# Patient Record
Sex: Male | Born: 1960 | Hispanic: No | Marital: Single | Smoking: Never smoker
Health system: Southern US, Community
[De-identification: ages and names within clinical notes are randomized; demographics above are authoritative.]

## PROBLEM LIST (undated history)

## (undated) DIAGNOSIS — Z789 Other specified health status: Secondary | ICD-10-CM

---

## 1997-04-11 HISTORY — PX: NOSE SURGERY: SHX723

## 2010-04-03 ENCOUNTER — Emergency Department (HOSPITAL_COMMUNITY)
Admission: EM | Admit: 2010-04-03 | Discharge: 2010-04-04 | Payer: Self-pay | Source: Home / Self Care | Admitting: Emergency Medicine

## 2010-06-21 LAB — POCT I-STAT, CHEM 8
Chloride: 105 mEq/L (ref 96–112)
Creatinine, Ser: 0.9 mg/dL (ref 0.4–1.5)
HCT: 43 % (ref 39.0–52.0)
Hemoglobin: 14.6 g/dL (ref 13.0–17.0)
Potassium: 4.4 mEq/L (ref 3.5–5.1)
Sodium: 140 mEq/L (ref 135–145)

## 2010-06-21 LAB — CBC
HCT: 40.7 % (ref 39.0–52.0)
Hemoglobin: 13.7 g/dL (ref 13.0–17.0)
MCH: 28.4 pg (ref 26.0–34.0)
MCV: 84.4 fL (ref 78.0–100.0)
RBC: 4.82 MIL/uL (ref 4.22–5.81)

## 2010-06-21 LAB — DIFFERENTIAL
Eosinophils Absolute: 0.1 10*3/uL (ref 0.0–0.7)
Lymphs Abs: 2.4 10*3/uL (ref 0.7–4.0)
Monocytes Absolute: 0.6 10*3/uL (ref 0.1–1.0)
Monocytes Relative: 8 % (ref 3–12)
Neutrophils Relative %: 60 % (ref 43–77)

## 2010-06-21 LAB — RAPID URINE DRUG SCREEN, HOSP PERFORMED
Benzodiazepines: NOT DETECTED
Cocaine: NOT DETECTED
Opiates: NOT DETECTED
Tetrahydrocannabinol: NOT DETECTED

## 2010-06-21 LAB — POCT CARDIAC MARKERS: Myoglobin, poc: 40.2 ng/mL (ref 12–200)

## 2018-01-12 DIAGNOSIS — K219 Gastro-esophageal reflux disease without esophagitis: Secondary | ICD-10-CM | POA: Diagnosis not present

## 2018-01-12 DIAGNOSIS — E7800 Pure hypercholesterolemia, unspecified: Secondary | ICD-10-CM | POA: Diagnosis not present

## 2018-01-12 DIAGNOSIS — Z23 Encounter for immunization: Secondary | ICD-10-CM | POA: Diagnosis not present

## 2018-01-12 DIAGNOSIS — Z125 Encounter for screening for malignant neoplasm of prostate: Secondary | ICD-10-CM | POA: Diagnosis not present

## 2018-01-12 DIAGNOSIS — Z0000 Encounter for general adult medical examination without abnormal findings: Secondary | ICD-10-CM | POA: Diagnosis not present

## 2018-03-01 DIAGNOSIS — K649 Unspecified hemorrhoids: Secondary | ICD-10-CM | POA: Diagnosis not present

## 2019-01-11 ENCOUNTER — Other Ambulatory Visit: Payer: Self-pay

## 2019-01-11 DIAGNOSIS — Z20822 Contact with and (suspected) exposure to covid-19: Secondary | ICD-10-CM

## 2019-01-12 LAB — NOVEL CORONAVIRUS, NAA: SARS-CoV-2, NAA: NOT DETECTED

## 2019-01-18 DIAGNOSIS — Z0000 Encounter for general adult medical examination without abnormal findings: Secondary | ICD-10-CM | POA: Diagnosis not present

## 2019-01-25 DIAGNOSIS — E7800 Pure hypercholesterolemia, unspecified: Secondary | ICD-10-CM | POA: Diagnosis not present

## 2019-01-25 DIAGNOSIS — Z125 Encounter for screening for malignant neoplasm of prostate: Secondary | ICD-10-CM | POA: Diagnosis not present

## 2019-01-25 DIAGNOSIS — Z131 Encounter for screening for diabetes mellitus: Secondary | ICD-10-CM | POA: Diagnosis not present

## 2019-04-24 ENCOUNTER — Other Ambulatory Visit: Payer: Self-pay

## 2019-04-24 ENCOUNTER — Ambulatory Visit: Payer: Self-pay | Admitting: Cardiology

## 2019-07-25 DIAGNOSIS — J029 Acute pharyngitis, unspecified: Secondary | ICD-10-CM | POA: Diagnosis not present

## 2020-01-31 DIAGNOSIS — R319 Hematuria, unspecified: Secondary | ICD-10-CM | POA: Diagnosis not present

## 2020-01-31 DIAGNOSIS — Z125 Encounter for screening for malignant neoplasm of prostate: Secondary | ICD-10-CM | POA: Diagnosis not present

## 2020-01-31 DIAGNOSIS — Z23 Encounter for immunization: Secondary | ICD-10-CM | POA: Diagnosis not present

## 2020-01-31 DIAGNOSIS — Z0000 Encounter for general adult medical examination without abnormal findings: Secondary | ICD-10-CM | POA: Diagnosis not present

## 2020-01-31 DIAGNOSIS — E7800 Pure hypercholesterolemia, unspecified: Secondary | ICD-10-CM | POA: Diagnosis not present

## 2020-02-03 DIAGNOSIS — Z125 Encounter for screening for malignant neoplasm of prostate: Secondary | ICD-10-CM | POA: Diagnosis not present

## 2020-07-11 DIAGNOSIS — Z20822 Contact with and (suspected) exposure to covid-19: Secondary | ICD-10-CM | POA: Diagnosis not present

## 2020-07-11 DIAGNOSIS — Z03818 Encounter for observation for suspected exposure to other biological agents ruled out: Secondary | ICD-10-CM | POA: Diagnosis not present

## 2020-07-23 DIAGNOSIS — R55 Syncope and collapse: Secondary | ICD-10-CM | POA: Diagnosis not present

## 2020-07-23 DIAGNOSIS — R053 Chronic cough: Secondary | ICD-10-CM | POA: Diagnosis not present

## 2020-07-23 DIAGNOSIS — T148XXA Other injury of unspecified body region, initial encounter: Secondary | ICD-10-CM | POA: Diagnosis not present

## 2020-07-27 DIAGNOSIS — M545 Low back pain, unspecified: Secondary | ICD-10-CM | POA: Diagnosis not present

## 2020-07-27 DIAGNOSIS — R55 Syncope and collapse: Secondary | ICD-10-CM | POA: Diagnosis not present

## 2020-07-27 DIAGNOSIS — R058 Other specified cough: Secondary | ICD-10-CM | POA: Diagnosis not present

## 2020-07-29 DIAGNOSIS — R55 Syncope and collapse: Secondary | ICD-10-CM | POA: Diagnosis not present

## 2020-09-30 DIAGNOSIS — R058 Other specified cough: Secondary | ICD-10-CM | POA: Diagnosis not present

## 2020-10-30 DIAGNOSIS — K219 Gastro-esophageal reflux disease without esophagitis: Secondary | ICD-10-CM | POA: Diagnosis not present

## 2020-10-30 DIAGNOSIS — R058 Other specified cough: Secondary | ICD-10-CM | POA: Diagnosis not present

## 2020-10-30 DIAGNOSIS — R55 Syncope and collapse: Secondary | ICD-10-CM | POA: Diagnosis not present

## 2021-02-05 DIAGNOSIS — E7800 Pure hypercholesterolemia, unspecified: Secondary | ICD-10-CM | POA: Diagnosis not present

## 2021-02-05 DIAGNOSIS — Z0000 Encounter for general adult medical examination without abnormal findings: Secondary | ICD-10-CM | POA: Diagnosis not present

## 2021-02-05 DIAGNOSIS — R7303 Prediabetes: Secondary | ICD-10-CM | POA: Diagnosis not present

## 2021-02-05 DIAGNOSIS — Z125 Encounter for screening for malignant neoplasm of prostate: Secondary | ICD-10-CM | POA: Diagnosis not present

## 2021-02-05 DIAGNOSIS — K219 Gastro-esophageal reflux disease without esophagitis: Secondary | ICD-10-CM | POA: Diagnosis not present

## 2021-02-19 ENCOUNTER — Ambulatory Visit (INDEPENDENT_AMBULATORY_CARE_PROVIDER_SITE_OTHER): Payer: BC Managed Care – PPO

## 2021-02-19 ENCOUNTER — Ambulatory Visit: Payer: BC Managed Care – PPO | Admitting: Podiatry

## 2021-02-19 ENCOUNTER — Ambulatory Visit (INDEPENDENT_AMBULATORY_CARE_PROVIDER_SITE_OTHER): Payer: BC Managed Care – PPO | Admitting: Podiatry

## 2021-02-19 ENCOUNTER — Other Ambulatory Visit: Payer: Self-pay

## 2021-02-19 DIAGNOSIS — M21619 Bunion of unspecified foot: Secondary | ICD-10-CM | POA: Diagnosis not present

## 2021-02-24 ENCOUNTER — Encounter: Payer: Self-pay | Admitting: Podiatry

## 2021-02-24 NOTE — Progress Notes (Signed)
  Subjective:  Patient ID: Isaiah Stewart, male    DOB: [birth year],  MRN: [id number]  No chief complaint on file.   60 y.o. male presents with the above complaint.  Patient presents with complaint of left bunion deformity.  Patient states is painful to touch.  He states it aches once in a while however the pain is mildly in manageable.  He wanted to discuss options conservative as well as surgical to decrease the bump size.  He denies any other acute complaints.  He has not seen anyone else prior to seeing me.  He would like to discuss treatment options for this.  Review of Systems: Negative except as noted in the HPI. Denies N/V/F/Ch.  No past medical history on file.  Current Outpatient Medications:    FLONASE SENSIMIST 27.5 MCG/SPRAY nasal spray, SMARTSIG:1 Spray(s) Both Nares Daily, Disp: , Rfl:   Social History   Tobacco Use  Smoking Status Not on file  Smokeless Tobacco Not on file    Not on File Objective:  There were no vitals filed for this visit. There is no height or weight on file to calculate BMI. Constitutional Well developed. Well nourished.  Vascular Dorsalis pedis pulses palpable bilaterally. Posterior tibial pulses palpable bilaterally. Capillary refill normal to all digits.  No cyanosis or clubbing noted. Pedal hair growth normal.  Neurologic Normal speech. Oriented to person, place, and time. Epicritic sensation to light touch grossly present bilaterally.  Dermatologic Nails well groomed and normal in appearance. No open wounds. No skin lesions.  Orthopedic: Normal joint ROM without pain or crepitus bilaterally. Hallux abductovalgus deformity present.  This is a tracking not a track bound deformity.  No intra-articular pain at the first MPJ.  Moding but moderate bunion deformity present Left 1st MPJ diminished range of motion. Left 1st TMT without gross hypermobility. Right 1st MPJ diminished range of motion  Right 1st TMT without gross  hypermobility. Lesser digital contractures present bilaterally.   Radiographs: Taken and reviewed. Hallux abductovalgus deformity present. Metatarsal parabola normal. 1st/2nd IMA: Moderate bunion; TSP: 5 out of 7  Assessment:   1. Bunion    Plan:  Patient was evaluated and treated and all questions answered.  Hallux abductovalgus deformity, left -XR as above. -I discussed all conservative treatment options with the patient as well as surgical options.  Patient has a moderate bunion deformity he will benefit from surgical reduction of the bunion.  I discussed my surgical plan with the patient in extensive detail.  At this time he would like to discuss with him about the surgery a with his family and get back to me when he is ready to have it corrected. -I discussed shoe gear moderate modification orthotics as well.  No follow-ups on file.

## 2021-03-17 ENCOUNTER — Emergency Department (HOSPITAL_COMMUNITY)
Admission: EM | Admit: 2021-03-17 | Discharge: 2021-03-18 | Disposition: A | Discharge status: Home / Self Care | Payer: BC Managed Care – PPO | Attending: Medical | Admitting: Medical

## 2021-03-17 ENCOUNTER — Encounter (HOSPITAL_COMMUNITY): Payer: Self-pay

## 2021-03-17 ENCOUNTER — Emergency Department (HOSPITAL_COMMUNITY): Payer: BC Managed Care – PPO

## 2021-03-17 DIAGNOSIS — Z5321 Procedure and treatment not carried out due to patient leaving prior to being seen by health care provider: Secondary | ICD-10-CM | POA: Diagnosis not present

## 2021-03-17 DIAGNOSIS — I959 Hypotension, unspecified: Secondary | ICD-10-CM | POA: Diagnosis not present

## 2021-03-17 DIAGNOSIS — R000 Tachycardia, unspecified: Secondary | ICD-10-CM | POA: Diagnosis not present

## 2021-03-17 DIAGNOSIS — R059 Cough, unspecified: Secondary | ICD-10-CM | POA: Diagnosis not present

## 2021-03-17 DIAGNOSIS — S060X0A Concussion without loss of consciousness, initial encounter: Secondary | ICD-10-CM | POA: Diagnosis not present

## 2021-03-17 DIAGNOSIS — U071 COVID-19: Secondary | ICD-10-CM | POA: Diagnosis not present

## 2021-03-17 DIAGNOSIS — R55 Syncope and collapse: Secondary | ICD-10-CM | POA: Diagnosis not present

## 2021-03-17 DIAGNOSIS — R0902 Hypoxemia: Secondary | ICD-10-CM | POA: Diagnosis not present

## 2021-03-17 HISTORY — DX: Other specified health status: Z78.9

## 2021-03-17 LAB — BASIC METABOLIC PANEL
Anion gap: 9 (ref 5–15)
BUN: 14 mg/dL (ref 6–20)
CO2: 25 mmol/L (ref 22–32)
Calcium: 8.7 mg/dL — ABNORMAL LOW (ref 8.9–10.3)
Chloride: 103 mmol/L (ref 98–111)
Creatinine, Ser: 1.07 mg/dL (ref 0.61–1.24)
GFR, Estimated: >60 mL/min (ref >60)
Glucose, Bld: 113 mg/dL — ABNORMAL HIGH (ref 70–99)
Potassium: 3.9 mmol/L (ref 3.5–5.1)
Sodium: 137 mmol/L (ref 135–145)

## 2021-03-17 LAB — CBC WITH DIFFERENTIAL/PLATELET
Abs Immature Granulocytes: 0.08 10*3/uL — ABNORMAL HIGH (ref 0.00–0.07)
Basophils Absolute: 0 10*3/uL (ref 0.0–0.1)
Basophils Relative: 0 %
Eosinophils Absolute: 0 10*3/uL (ref 0.0–0.5)
Eosinophils Relative: 0 %
HCT: 40.8 % (ref 39.0–52.0)
Hemoglobin: 13.4 g/dL (ref 13.0–17.0)
Immature Granulocytes: 1 %
Lymphocytes Relative: 4 %
Lymphs Abs: 0.6 10*3/uL — ABNORMAL LOW (ref 0.7–4.0)
MCH: 28.8 pg (ref 26.0–34.0)
MCHC: 32.8 g/dL (ref 30.0–36.0)
MCV: 87.6 fL (ref 80.0–100.0)
Monocytes Absolute: 0.9 10*3/uL (ref 0.1–1.0)
Monocytes Relative: 7 %
Neutro Abs: 11.6 10*3/uL — ABNORMAL HIGH (ref 1.7–7.7)
Neutrophils Relative %: 88 %
Platelets: 189 10*3/uL (ref 150–400)
RBC: 4.66 MIL/uL (ref 4.22–5.81)
RDW: 12.3 % (ref 11.5–15.5)
WBC: 13.2 10*3/uL — ABNORMAL HIGH (ref 4.0–10.5)
nRBC: 0 % (ref 0.0–0.2)

## 2021-03-17 LAB — RESP PANEL BY RT-PCR (FLU A&B, COVID) ARPGX2
Influenza A by PCR: NEGATIVE
Influenza B by PCR: NEGATIVE
SARS Coronavirus 2 by RT PCR: POSITIVE — AB

## 2021-03-17 NOTE — ED Provider Notes (Signed)
Emergency Medicine Provider Triage Evaluation Note  Isaiah Stewart , a 60 y.o. male  was evaluated in triage.  Pt complains of syncopal episode that occurred earlier tonight.  Patient reports that he has had URI-like symptoms since yesterday after being around family member with similar symptoms.  He states that he woke up from a nap around 8 PM today and went to use the bathroom.  He urinated normally however when he got up to go to the kitchen he passed out.  He does report that he hit the posterior aspect of his head.  He is not anticoagulated.  He states that he has not had 1 episode of PE in April and was told that he likely vagal down and is unsure if this is what happened today.  He denies any chest pain or shortness of breath or headache or to syncope.  Currently he states he feels fine besides having a cough, body aches, fevers.  Review of Systems  Positive: + syncope, cough, fever, body aches, congestion Negative: - chest pain or SOB  Physical Exam  BP 101/69   Pulse (!) 109   Temp 99.8 F (37.7 C) (Oral)   Resp 16   SpO2 95%  Gen:   Awake, no distress   Resp:  Normal effort  MSK:   Moves extremities without difficulty  Other:  Mild TTP to posterior aspect of head. No raccoon's sign or battle's sign. Following all commands. LCTAB.   Medical Decision Making  Medically screening exam initiated at 9:35 PM.  Appropriate orders placed.  Isaiah Stewart was informed that the remainder of the evaluation will be completed by another provider, this initial triage assessment does not replace that evaluation, and the importance of remaining in the ED until their evaluation is complete.     Tanda Rockers, PA-C 03/17/21 2138    Gerhard Munch, MD 03/17/21 2139

## 2021-03-17 NOTE — ED Triage Notes (Signed)
Pt BIB [facility] for eval of syncopal episode. Fell, no thinners. Initial pressure for EMS was 80/50 for ED. #16G R FA, rec'd 500cc NS. 120/84 after bolus of fluids. No PMH, no meds. CBG 150, 12 lead unremarkable. 97% 2LPM.

## 2021-03-18 DIAGNOSIS — R55 Syncope and collapse: Secondary | ICD-10-CM | POA: Diagnosis not present

## 2021-03-18 DIAGNOSIS — U071 COVID-19: Secondary | ICD-10-CM | POA: Diagnosis not present

## 2021-03-18 NOTE — ED Notes (Signed)
PT  decided to leave  .  IV removed 

## 2021-04-16 ENCOUNTER — Ambulatory Visit: Payer: BC Managed Care – PPO | Admitting: Cardiology

## 2021-04-16 ENCOUNTER — Encounter: Payer: Self-pay | Admitting: Cardiology

## 2021-04-16 ENCOUNTER — Other Ambulatory Visit: Payer: Self-pay

## 2021-04-16 VITALS — BP 124/79 | HR 89 | Temp 98.3°F | Resp 17 | Ht 64.0 in | Wt 138.4 lb

## 2021-04-16 DIAGNOSIS — R55 Syncope and collapse: Secondary | ICD-10-CM

## 2021-04-16 DIAGNOSIS — E7800 Pure hypercholesterolemia, unspecified: Secondary | ICD-10-CM | POA: Diagnosis not present

## 2021-04-16 NOTE — Progress Notes (Signed)
Primary Physician/Referring:  Lennie Odor, PA  Patient ID: Isaiah Stewart, male    DOB: [birth year], 61 y.o.   MRN: [id number]  Chief Complaint  Patient presents with   New Patient (Initial Visit)   Loss of Consciousness   HPI:    Isaiah Stewart  is a 61 y.o. [place] male, with no signal prior cardiovascular history except for hyperlipidemia not on therapy, referred to me for evaluation of recurrent syncope.  Patient has had 3 episodes of syncope since April 2022, all of them associated with acute illness.  First episode occurred with severe back pain due to severe cough and development of COVID and the second episode recently when he presented to the emergency room again when he had COVID infection and cough and marked diaphoresis, sweating and not feeling well.  Recently he still recuperating and feels weak and tired and has mild cough occasionally but otherwise feels well.  Past Medical History:  Diagnosis Date   Medical history non-contributory    Past Surgical History:  Procedure Laterality Date   NOSE SURGERY  1999   Family History  Problem Relation Age of Onset   Lung cancer Mother 50   Heart attack Father 18   Hypertension Brother    Hyperlipidemia Brother    Anxiety disorder Brother     Social History   Tobacco Use   Smoking status: Never   Smokeless tobacco: Never  Substance Use Topics   Alcohol use: Not Currently   Marital Status: Single  ROS  Review of Systems  Constitutional: Positive for malaise/fatigue.  Cardiovascular:  Negative for chest pain, dyspnea on exertion and leg swelling.  Respiratory:  Positive for cough.   Gastrointestinal:  Negative for melena.  Objective  Blood pressure 124/79, pulse 89, temperature 98.3 F (36.8 C), temperature source Temporal, resp. rate 17, height 5\' 4"  (1.626 m), weight 138 lb 6.4 oz (62.8 kg), SpO2 98 %. Body mass index is 23.76 kg/m.  Vitals with BMI 04/16/2021 03/17/2021 03/17/2021  Height 5\' 4"  - -  Weight  138 lbs 6 oz - -  BMI 99991111 - -  Systolic A999333 98 99991111  Diastolic 79 69 69  Pulse 89 111 109    Orthostatic VS for the past 72 hrs (Last 3 readings):  Orthostatic BP Patient Position BP Location Cuff Size Orthostatic Pulse  04/16/21 0923 112/77 Standing Left Arm Normal 95  04/16/21 0922 122/82 Sitting Left Arm Normal 88  04/16/21 0921 125/80 Supine Left Arm Normal 83    Physical Exam Neck:     Vascular: No carotid bruit or JVD.  Cardiovascular:     Rate and Rhythm: Normal rate and regular rhythm.     Pulses: Intact distal pulses.     Heart sounds: Normal heart sounds. No murmur heard.   No gallop.  Pulmonary:     Effort: Pulmonary effort is normal.     Breath sounds: Normal breath sounds.  Abdominal:     General: Bowel sounds are normal.     Palpations: Abdomen is soft.  Musculoskeletal:        General: No swelling.     Laboratory examination:   Recent Labs    03/17/21 2136  NA 137  K 3.9  CL 103  CO2 25  GLUCOSE 113*  BUN 14  CREATININE 1.07  CALCIUM 8.7*  GFRNONAA >60   CrCl cannot be calculated (Patient's most recent lab result is older than the maximum 21 days allowed.).  CMP Latest Ref  Rng & Units 03/17/2021 04/04/2010  Glucose 70 - 99 mg/dL 113(H) 94  BUN 6 - 20 mg/dL 14 30(H)  Creatinine 0.61 - 1.24 mg/dL 1.07 0.9  Sodium 135 - 145 mmol/L 137 140  Potassium 3.5 - 5.1 mmol/L 3.9 4.4  Chloride 98 - 111 mmol/L 103 105  CO2 22 - 32 mmol/L 25 -  Calcium 8.9 - 10.3 mg/dL 8.7(L) -   CBC Latest Ref Rng & Units 03/17/2021 04/04/2010 04/04/2010  WBC 4.0 - 10.5 K/uL 13.2(H) - 7.8  Hemoglobin 13.0 - 17.0 g/dL 13.4 14.6 13.7  Hematocrit 39.0 - 52.0 % 40.8 43.0 40.7  Platelets 150 - 400 K/uL 189 - 213   External labs:   Labs 02/05/2021:  Total cholesterol 271, triglycerides 204, HDL 48, LDL 185.  Non-HDL cholesterol: 23.  TSH normal at 1.15.  Urine analysis normal. Medications and allergies  No Known Allergies   Medication prior to this encounter:    Outpatient Medications Prior to Visit  Medication Sig Dispense Refill   MULTIPLE VITAMIN PO Take by mouth.     omeprazole (PRILOSEC) 10 MG capsule Take 10 mg by mouth daily.     Vitamin D, Cholecalciferol, 25 MCG (1000 UT) CAPS Take 1 capsule by mouth daily.     FLONASE SENSIMIST 27.5 MCG/SPRAY nasal spray SMARTSIG:1 Spray(s) Both Nares Daily     No facility-administered medications prior to visit.     Medication list after today's encounter   Current Outpatient Medications  Medication Instructions   MULTIPLE VITAMIN PO Oral   omeprazole (PRILOSEC) 10 mg, Oral, Daily   Vitamin D, Cholecalciferol, 25 MCG (1000 UT) CAPS 1 capsule, Oral, Daily    Radiology:   CT scan of the head 03/17/2021: Within normal limits.  Chest x-ray PA and lateral view 03/17/2021: Within normal limits.  Cardiac Studies:   NA  EKG:   EKG 04/16/2021: Normal sinus rhythm at rate of 84 bpm, normal axis.  No evidence of ischemia, normal EKG.    Assessment     ICD-10-CM   1. Vasovagal syncope  R55 EKG 12-Lead    PCV ECHOCARDIOGRAM COMPLETE    PCV CARDIAC STRESS TEST    CT CARDIAC SCORING ([facility] LOCATIONS ONLY)    2. Hypercholesteremia  E78.00 CT CARDIAC SCORING ([facility] LOCATIONS ONLY)       Medications Discontinued During This Encounter  Medication Reason   FLONASE SENSIMIST 27.5 MCG/SPRAY nasal spray     No orders of the defined types were placed in this encounter.  Orders Placed This Encounter  Procedures   CT CARDIAC SCORING ([facility] LOCATIONS ONLY)    Standing Status:   Future    Standing Expiration Date:   06/14/2021    Order Specific Question:   Preferred imaging location?    Answer:   [facility]   PCV CARDIAC STRESS TEST    Standing Status:   Future    Standing Expiration Date:   06/14/2021   EKG 12-Lead   PCV ECHOCARDIOGRAM COMPLETE    Standing Status:   Future    Standing Expiration Date:   04/16/2022   Recommendations:   Isaiah Stewart is a 61 y.o. [place] male, with no signal prior  cardiovascular history except for hyperlipidemia not on therapy, referred to me for evaluation of recurrent syncope.  Patient has had 3 episodes of syncope since April 2022, all of them associated with acute illness.  First episode occurred with severe back pain due to severe cough and development of COVID and the  second episode recently when he presented to the emergency room again when he had COVID infection and cough and marked diaphoresis, sweating and not feeling well.  Physical examination is completely normal, EKG is normal, there is no family history of sudden cardiac death.  He lives a healthy lifestyle, also tries to exercise regularly.  He is minimally orthostatic, he still recuperating from acute illness but has started to feel better.  I suspect vasovagal syncope.  Fall precautions discussed.  I will schedule him for a routine treadmill exercise stress test and also an echocardiogram, in view of mild hyperlipidemia which I suspect is probably familial, I have also recommended a coronary calcium score for risk stratification.  I would like to see him back in 6 weeks for follow-up and I will make further recommendations.  I have reviewed the CT scan report and chest x-ray report along with EKG and labs from the emergency room visit.    Adrian Prows, MD, [facility] 04/16/2021, 10:24 AM Office: [phone]

## 2021-05-10 ENCOUNTER — Other Ambulatory Visit: Payer: Self-pay

## 2021-05-10 ENCOUNTER — Ambulatory Visit: Payer: BC Managed Care – PPO

## 2021-05-10 DIAGNOSIS — R55 Syncope and collapse: Secondary | ICD-10-CM

## 2021-05-14 ENCOUNTER — Other Ambulatory Visit: Payer: Self-pay

## 2021-05-14 ENCOUNTER — Ambulatory Visit: Payer: BC Managed Care – PPO

## 2021-05-14 DIAGNOSIS — R55 Syncope and collapse: Secondary | ICD-10-CM

## 2021-05-16 LAB — PCV CARDIAC STRESS TEST: ST Depression (mm): 0 mm

## 2021-05-17 ENCOUNTER — Ambulatory Visit
Admission: RE | Admit: 2021-05-17 | Discharge: 2021-05-17 | Disposition: A | Discharge status: Home / Self Care | Payer: No Typology Code available for payment source | Source: Ambulatory Visit | Attending: Cardiology | Admitting: Cardiology

## 2021-05-17 DIAGNOSIS — E7800 Pure hypercholesterolemia, unspecified: Secondary | ICD-10-CM

## 2021-05-17 DIAGNOSIS — R55 Syncope and collapse: Secondary | ICD-10-CM

## 2021-05-17 NOTE — Progress Notes (Signed)
Coronary calcium score 05/17/2021: LM 0 LAD 179 LCx 88 RCA 0 Total Alperstein score 267, [place] database percentile 92. Ascending and descending thoracic aortic measurements normal.  Visualized noncardiac structures do not reveal any significant abnormality.

## 2021-05-28 ENCOUNTER — Ambulatory Visit: Payer: BC Managed Care – PPO | Admitting: Cardiology

## 2021-05-28 ENCOUNTER — Other Ambulatory Visit: Payer: Self-pay

## 2021-05-28 ENCOUNTER — Encounter: Payer: Self-pay | Admitting: Cardiology

## 2021-05-28 VITALS — BP 120/82 | HR 98 | Temp 97.7°F | Ht 64.0 in | Wt 139.0 lb

## 2021-05-28 DIAGNOSIS — E7800 Pure hypercholesterolemia, unspecified: Secondary | ICD-10-CM

## 2021-05-28 DIAGNOSIS — I9589 Other hypotension: Secondary | ICD-10-CM

## 2021-05-28 DIAGNOSIS — E559 Vitamin D deficiency, unspecified: Secondary | ICD-10-CM

## 2021-05-28 DIAGNOSIS — R931 Abnormal findings on diagnostic imaging of heart and coronary circulation: Secondary | ICD-10-CM

## 2021-05-28 DIAGNOSIS — E861 Hypovolemia: Secondary | ICD-10-CM

## 2021-05-28 MED ORDER — EZETIMIBE 10 MG PO TABS: 10.0000 mg | ORAL_TABLET | Freq: Every day | ORAL | 2 refills | Status: DC

## 2021-05-28 MED ORDER — ATORVASTATIN CALCIUM 40 MG PO TABS: 40.0000 mg | ORAL_TABLET | Freq: Every day | ORAL | 3 refills | Status: DC

## 2021-05-28 NOTE — Progress Notes (Signed)
Primary Physician/Referring:  Lennie Odor, PA  Patient ID: Isaiah Stewart, male    DOB: Oct 17, 1960, 61 y.o.   MRN: [id number]  Chief Complaint  Patient presents with   Loss of Consciousness   Follow-up   Hyperlipidemia   HPI:    Isaiah Stewart  is a 61 y.o. [place] male, with no signal prior cardiovascular history except for hyperlipidemia not on therapy, referred to me for evaluation of recurrent syncope.  Patient has had 3 episodes of syncope since April 2022, all of them associated with acute illness.  First episode occurred with severe back pain due to severe cough and development of COVID and the second episode on 03/17/2021 when he presented to the emergency room again when he had COVID infection and cough and marked diaphoresis, sweating and not feeling well.  Since last office visit 6 weeks ago, patient has recuperated well, fatigue has improved, he has had occasional dizziness when he suddenly stands up but overall feels well with no recurrence of syncope.  Denies chest pain or dyspnea.  Has returned back to exercise.  Past Medical History:  Diagnosis Date   Medical history non-contributory    Past Surgical History:  Procedure Laterality Date   NOSE SURGERY  1999   Family History  Problem Relation Age of Onset   Lung cancer Mother 52   Heart attack Father 80   Hypertension Brother    Hyperlipidemia Brother    Anxiety disorder Brother     Social History   Tobacco Use   Smoking status: Never   Smokeless tobacco: Never  Substance Use Topics   Alcohol use: Not Currently   Marital Status: Single  ROS  Review of Systems  Cardiovascular:  Negative for chest pain, dyspnea on exertion and leg swelling.  Objective  Blood pressure 120/82, pulse 98, temperature 97.7 F (36.5 C), temperature source Temporal, height 5\' 4"  (1.626 m), weight 139 lb (63 kg), SpO2 97 %. Body mass index is 23.86 kg/m.  Vitals with BMI 05/28/2021 04/16/2021 03/17/2021  Height 5\' 4"  5\' 4"  -   Weight 139 lbs 138 lbs 6 oz -  BMI 0000000 99991111 -  Systolic 123456 A999333 98  Diastolic 82 79 69  Pulse 98 89 111    Orthostatic VS for the past 72 hrs (Last 3 readings):  Orthostatic BP Patient Position BP Location Cuff Size Orthostatic Pulse  05/28/21 0933 92/76 Standing Left Arm Normal 103  05/28/21 0932 115/77 Sitting Left Arm Normal 96  05/28/21 0931 121/80 Supine Left Arm Normal 89    Physical Exam Constitutional:      Comments: Morbidly obese in no acute distress.  Neck:     Vascular: No carotid bruit or JVD.  Cardiovascular:     Rate and Rhythm: Normal rate and regular rhythm.     Pulses: Intact distal pulses.          Carotid pulses are 2+ on the right side and 2+ on the left side.      Dorsalis pedis pulses are 2+ on the right side and 2+ on the left side.       Posterior tibial pulses are 2+ on the right side and 2+ on the left side.     Heart sounds: Normal heart sounds. No murmur heard.   No gallop.     Comments: Femoral and popliteal pulse difficult to feel due to patient's body habitus.  No leg edema. JVD difficult to see due to short neck. Pulmonary:  Effort: Pulmonary effort is normal.     Breath sounds: Normal breath sounds.  Abdominal:     General: Bowel sounds are normal.     Palpations: Abdomen is soft.     Comments: Obese. Pannus present  Musculoskeletal:     Right lower leg: No edema.     Left lower leg: No edema.     Laboratory examination:   Recent Labs    03/17/21 2136  NA 137  K 3.9  CL 103  CO2 25  GLUCOSE 113*  BUN 14  CREATININE 1.07  CALCIUM 8.7*  GFRNONAA >60   CrCl cannot be calculated (Patient's most recent lab result is older than the maximum 21 days allowed.).  CMP Latest Ref Rng & Units 03/17/2021 04/04/2010  Glucose 70 - 99 mg/dL 113(H) 94  BUN 6 - 20 mg/dL 14 30(H)  Creatinine 0.61 - 1.24 mg/dL 1.07 0.9  Sodium 135 - 145 mmol/L 137 140  Potassium 3.5 - 5.1 mmol/L 3.9 4.4  Chloride 98 - 111 mmol/L 103 105  CO2 22 - 32  mmol/L 25 -  Calcium 8.9 - 10.3 mg/dL 8.7(L) -   CBC Latest Ref Rng & Units 03/17/2021 04/04/2010 04/04/2010  WBC 4.0 - 10.5 K/uL 13.2(H) - 7.8  Hemoglobin 13.0 - 17.0 g/dL 13.4 14.6 13.7  Hematocrit 39.0 - 52.0 % 40.8 43.0 40.7  Platelets 150 - 400 K/uL 189 - 213   External labs:   Labs 02/05/2021:  Total cholesterol 271, triglycerides 204, HDL 48, LDL 185.  Non-HDL cholesterol: 23.  TSH normal at 1.15.  Urine analysis normal. Medications and allergies  No Known Allergies   Medication prior to this encounter:   Outpatient Medications Prior to Visit  Medication Sig Dispense Refill   MULTIPLE VITAMIN PO Take 1 tablet by mouth daily.     omeprazole (PRILOSEC) 10 MG capsule Take 10 mg by mouth as needed.     Vitamin D, Cholecalciferol, 25 MCG (1000 UT) CAPS Take 1 capsule by mouth daily.     No facility-administered medications prior to visit.     Medication list after today's encounter   Current Outpatient Medications  Medication Instructions   atorvastatin (LIPITOR) 40 mg, Oral, Daily   ezetimibe (ZETIA) 10 mg, Oral, Daily after supper   MULTIPLE VITAMIN PO 1 tablet, Oral, Daily   omeprazole (PRILOSEC) 10 mg, Oral, As needed   Vitamin D, Cholecalciferol, 25 MCG (1000 UT) CAPS 1 capsule, Oral, Daily    Radiology:   CT scan of the head 03/17/2021: Within normal limits.  Chest x-ray PA and lateral view 03/17/2021: Within normal limits.  Cardiac Studies:   Coronary calcium score 05/17/2021: LM 0 LAD 179 LCx 88 RCA 0 Total Agatston score 267, [place] database percentile 92. Ascending and descending thoracic aortic measurements normal.  Visualized noncardiac structures do not reveal any significant abnormality.  PCV CARDIAC STRESS TEST 05/14/2021  Narrative Treadmill Exercise Stress 05/14/2021: The patient exercised for 7 minutes and 12 seconds on Bruce protocol; achieved 8.91 METs at 105% of maximum predicted heart rate. During exercise the patient developed no chest  pain, and exhibited no symptoms. Exercise was terminated due to max hr achieved. No ST-T changes of ischemia. The blood pressure response was normal. The heart rate response was normal. No arrhythmias noted except rare PVCs.  PCV ECHOCARDIOGRAM COMPLETE 05/10/2021  Narrative Echocardiogram 05/10/2021: Left ventricle cavity is normal in size and wall thickness. Normal global wall motion. Normal LV systolic function with EF 56%. Normal diastolic  filling pattern. Mild tricuspid regurgitation. Mild pulmonic regurgitation. No evidence of pulmonary hypertension.      EKG:   EKG 04/16/2021: Normal sinus rhythm at rate of 84 bpm, normal axis.  No evidence of ischemia, normal EKG.     Assessment     ICD-10-CM   1. Hypercholesteremia  E78.00 atorvastatin (LIPITOR) 40 MG tablet    ezetimibe (ZETIA) 10 MG tablet    Lipid Panel With LDL/HDL Ratio    Hepatic function panel    2. Elevated coronary artery calcium score 05/17/2021: Total Agatston score 267, [place] database percentile 92.  R93.1     3. Hypotension due to hypovolemia  I95.89    E86.1     4. Hypovitaminosis D  E55.9 VITAMIN D 25 Hydroxy (Vit-D Deficiency, Fractures)       There are no discontinued medications.   Meds ordered this encounter  Medications   atorvastatin (LIPITOR) 40 MG tablet    Sig: Take 1 tablet (40 mg total) by mouth daily.    Dispense:  90 tablet    Refill:  3   ezetimibe (ZETIA) 10 MG tablet    Sig: Take 1 tablet (10 mg total) by mouth daily after supper.    Dispense:  30 tablet    Refill:  2   Orders Placed This Encounter  Procedures   Lipid Panel With LDL/HDL Ratio   VITAMIN D 25 Hydroxy (Vit-D Deficiency, Fractures)   Hepatic function panel   Recommendations:   Arjun Santamarina is a 61 y.o. [place] male, with no signal prior cardiovascular history except for hyperlipidemia not on therapy, referred to me for evaluation of recurrent syncope.  Patient has had 3 episodes of syncope since April  2022, all of them associated with acute illness.  First episode occurred with severe back pain due to severe cough and development of COVID and the second episode on 03/17/2021 when he presented to the emergency room again when he had COVID infection and cough and marked diaphoresis, sweating and not feeling well.  I reviewed the results of the coronary calcium score which is at 90+ percentile, needs aggressive lipid-lowering therapy. I have added Lipitor 40 mg daily along with Zetia 10 mg daily.  Reviewed the results of the treadmill stress test which is low risk without any arrhythmias.  His syncope is clearly not related to coronary disease.  Syncope may also be related to orthostatic hypotension.  He is also orthostatic today, he was also orthostatic on his prior office visit but mildly.  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  Advised him to keep himself well-hydrated.  We will check lipids and also vitamin D level.  I will see him back in 2 to 3 months and if he remains stable on a as needed basis.  Primary prevention is indicated with regard to hyperlipidemia and elevated coronary calcium score.     Adrian Prows, MD, [facility] 05/29/2021, 8:10 AM Office: [phone]

## 2021-05-29 ENCOUNTER — Encounter: Payer: Self-pay | Admitting: Cardiology

## 2021-05-31 ENCOUNTER — Telehealth: Payer: Self-pay

## 2021-05-31 ENCOUNTER — Encounter: Payer: Self-pay | Admitting: Cardiology

## 2021-05-31 NOTE — Telephone Encounter (Signed)
Patient is on two cholesterol medications, Atorvastatin and Ezetimibe. He was reading the side effects on Ezetimibe and saw that "taking this a statin it may increase risk of liver damage." He wants to know that, if he should be worried? Please advise.

## 2021-05-31 NOTE — Telephone Encounter (Signed)
Not to worry 

## 2021-07-16 DIAGNOSIS — E7800 Pure hypercholesterolemia, unspecified: Secondary | ICD-10-CM | POA: Diagnosis not present

## 2021-07-16 DIAGNOSIS — E559 Vitamin D deficiency, unspecified: Secondary | ICD-10-CM | POA: Diagnosis not present

## 2021-07-17 LAB — HEPATIC FUNCTION PANEL
ALT: 83 IU/L — ABNORMAL HIGH (ref 0–44)
AST: 38 IU/L (ref 0–40)
Albumin: 4.8 g/dL (ref 3.8–4.9)
Alkaline Phosphatase: 150 IU/L — ABNORMAL HIGH (ref 44–121)
Bilirubin Total: 0.4 mg/dL (ref 0.0–1.2)
Bilirubin, Direct: 0.11 mg/dL (ref 0.00–0.40)
Total Protein: 7.2 g/dL (ref 6.0–8.5)

## 2021-07-17 LAB — LIPID PANEL WITH LDL/HDL RATIO
Cholesterol, Total: 127 mg/dL (ref 100–199)
HDL: 51 mg/dL (ref >39)
LDL Chol Calc (NIH): 55 mg/dL (ref 0–99)
LDL/HDL Ratio: 1.1 ratio (ref 0.0–3.6)
Triglycerides: 115 mg/dL (ref 0–149)
VLDL Cholesterol Cal: 21 mg/dL (ref 5–40)

## 2021-07-17 LAB — VITAMIN D 25 HYDROXY (VIT D DEFICIENCY, FRACTURES): Vit D, 25-Hydroxy: 39.4 ng/mL (ref 30.0–100.0)

## 2021-07-20 ENCOUNTER — Telehealth: Payer: Self-pay | Admitting: Cardiology

## 2021-07-20 NOTE — Telephone Encounter (Signed)
Patient wants to know if there are results for his blood work yet. If so, he wants someone to call back with those results. Can call at number provided. Thank you. ?

## 2021-07-23 NOTE — Telephone Encounter (Signed)
Has been send to [employer]

## 2021-07-30 ENCOUNTER — Ambulatory Visit: Payer: BC Managed Care – PPO | Admitting: Cardiology

## 2021-08-06 ENCOUNTER — Ambulatory Visit: Payer: BC Managed Care – PPO | Admitting: Cardiology

## 2021-08-06 ENCOUNTER — Encounter: Payer: Self-pay | Admitting: Cardiology

## 2021-08-06 VITALS — BP 110/78 | HR 98 | Temp 98.2°F | Resp 17 | Ht 64.0 in | Wt 138.8 lb

## 2021-08-06 DIAGNOSIS — R931 Abnormal findings on diagnostic imaging of heart and coronary circulation: Secondary | ICD-10-CM | POA: Diagnosis not present

## 2021-08-06 DIAGNOSIS — E7800 Pure hypercholesterolemia, unspecified: Secondary | ICD-10-CM

## 2021-08-06 MED ORDER — EZETIMIBE 10 MG PO TABS: 10.0000 mg | ORAL_TABLET | Freq: Every day | ORAL | 3 refills | Status: AC

## 2021-08-06 MED ORDER — ASPIRIN EC 81 MG PO TBEC: 81.0000 mg | DELAYED_RELEASE_TABLET | Freq: Every day | ORAL | 3 refills | Status: AC

## 2021-08-06 NOTE — Progress Notes (Signed)
? ?Primary Physician/Referring:  Milus Height, PA ? ?Patient ID: Isaiah Stewart, male    DOB: 04/27/1960, 61 y.o.   MRN: [id number] ? ?Chief Complaint  ?Patient presents with  ? ORTHOSTATIC HYPOTENSION  ? Hyperlipidemia  ?  2 MONTH  ? ?HPI:   ? ?Isaiah Stewart  is a 61 y.o.  [place] male, with no signal prior cardiovascular history except for hyperlipidemia not on therapy, initially seen for recurrent syncope felt to be clearly due to hypovolemic syncope during acute illness. ? ?Due to elevated coronary calcium score I will start him on high intensity statin along with Zetia he now presents for follow-up.  Remains asymptomatic. ? ?Past Medical History:  ?Diagnosis Date  ? Medical history non-contributory   ? ?Past Surgical History:  ?Procedure Laterality Date  ? NOSE SURGERY  1999  ? ?Family History  ?Problem Relation Age of Onset  ? Lung cancer Mother 25  ? Heart attack Father 75  ? Hypertension Brother   ? Hyperlipidemia Brother   ? Anxiety disorder Brother   ?  ?Social History  ? ?Tobacco Use  ? Smoking status: Never  ? Smokeless tobacco: Never  ?Substance Use Topics  ? Alcohol use: Not Currently  ? ?Marital Status: Single  ?ROS  ?Review of Systems  ?Cardiovascular:  Negative for chest pain, dyspnea on exertion and leg swelling.  ?Objective  ?Blood pressure 110/78, pulse 98, temperature 98.2 ?F (36.8 ?C), temperature source Temporal, resp. rate 17, height 5\' 4"  (1.626 m), weight 138 lb 12.8 oz (63 kg), SpO2 98 %. Body mass index is 23.82 kg/m?.  ? ?  08/06/2021  ? 11:22 AM 05/28/2021  ?  9:29 AM 04/16/2021  ?  9:20 AM  ?Vitals with BMI  ?Height 5\' 4"  5\' 4"  5\' 4"   ?Weight 138 lbs 13 oz 139 lbs 138 lbs 6 oz  ?BMI 23.81 23.85 23.74  ?Systolic 110 120 06/14/2021  ?Diastolic 78 82 79  ?Pulse 98 98 89  ?  ?Orthostatic VS for the past 72 hrs (Last 3 readings): ? Orthostatic BP Patient Position BP Location Cuff Size Orthostatic Pulse  ?08/06/21 1127 94/66 Standing Left Arm Normal 86  ?08/06/21 1126 115/73 Sitting Left Arm  Normal 83  ?08/06/21 1125 122/74 Supine Left Arm Normal 84  ?  ?Physical Exam ?Neck:  ?   Vascular: No JVD.  ?Cardiovascular:  ?   Rate and Rhythm: Normal rate and regular rhythm.  ?   Pulses: Intact distal pulses.  ?   Heart sounds: Normal heart sounds. No murmur heard. ?  No gallop.  ?Pulmonary:  ?   Effort: Pulmonary effort is normal.  ?   Breath sounds: Normal breath sounds.  ?Abdominal:  ?   General: Bowel sounds are normal.  ?   Palpations: Abdomen is soft.  ?Musculoskeletal:  ?   Right lower leg: No edema.  ?   Left lower leg: No edema.  ?  ?Laboratory examination:  ? ? ?  Latest Ref Rng & Units 07/16/2021  ?  8:54 AM 03/17/2021  ?  9:36 PM 04/04/2010  ? 12:19 AM  ?CMP  ?Glucose 70 - 99 mg/dL  08/08/21   94    ?BUN 6 - 20 mg/dL  14   30    ?Creatinine 0.61 - 1.24 mg/dL  09/15/2021   0.9    ?Sodium 135 - 145 mmol/L  137   140    ?Potassium 3.5 - 5.1 mmol/L  3.9   4.4    ?  Chloride 98 - 111 mmol/L  103   105    ?CO2 22 - 32 mmol/L  25     ?Calcium 8.9 - 10.3 mg/dL  8.7     ?Total Protein 6.0 - 8.5 g/dL 7.2      ?Total Bilirubin 0.0 - 1.2 mg/dL 0.4      ?Alkaline Phos 44 - 121 IU/L 150      ?AST 0 - 40 IU/L 38      ?ALT 0 - 44 IU/L 83      ? ?Lipid Panel ?Recent Labs  ?  07/16/21 ?0854  ?CHOL 127  ?TRIG 115  ?LDLCALC 55  ?HDL 51  ?  ?Vit D, 25-Hydroxy 07/16/21 30.0 - 100.0 ng/mL 39.4   ? ? ?External labs:  ? ?Labs 02/05/2021: ? ?Total cholesterol 271, triglycerides 204, HDL 48, LDL 185.  Non-HDL cholesterol: 23. ? ?TSH normal at 1.15. ? ?Urine analysis normal. ?Medications and allergies  ?No Known Allergies  ? ?  ? ?Medication list after today's encounter  ? ?Current Outpatient Medications:  ?  aspirin EC 81 MG tablet, Take 1 tablet (81 mg total) by mouth daily., Disp: 90 tablet, Rfl: 3 ?  fluticasone (FLONASE SENSIMIST) 27.5 MCG/SPRAY nasal spray, Place 2 sprays into the nose daily., Disp: , Rfl:  ?  Loratadine (CLARITIN) 10 MG CAPS, Take 1 capsule by mouth daily., Disp: , Rfl:  ?  MULTIPLE VITAMIN PO, Take 1 tablet by mouth  daily., Disp: , Rfl:  ?  omeprazole (PRILOSEC) 10 MG capsule, Take 10 mg by mouth as needed., Disp: , Rfl:  ?  Vitamin D, Cholecalciferol, 25 MCG (1000 UT) CAPS, Take 1 capsule by mouth daily., Disp: , Rfl:  ?  atorvastatin (LIPITOR) 20 MG tablet, Take 1 tablet (20 mg total) by mouth daily., Disp: 90 tablet, Rfl: 3 ?  ezetimibe (ZETIA) 10 MG tablet, Take 1 tablet (10 mg total) by mouth daily after supper., Disp: 90 tablet, Rfl: 3  ? ?Radiology:  ? ?CT scan of the head 03/17/2021: Within normal limits. ? ?Chest x-ray PA and lateral view 03/17/2021: Within normal limits. ? ?Cardiac Studies:  ? ?Coronary calcium score 05/17/2021: ?LM 0 ?LAD 179 ?LCx 88 ?RCA 0 ?Total Agatston score 267, [place] database percentile 92. ?Ascending and descending thoracic aortic measurements normal.  Visualized noncardiac structures do not reveal any significant abnormality. ? ?PCV CARDIAC STRESS TEST 05/14/2021  ?The patient exercised for 7 minutes and 12 seconds on Bruce protocol; achieved 8.91 METs at 105% of maximum predicted heart rate. During exercise the patient developed no chest pain, and exhibited no symptoms. Exercise was terminated due to max hr achieved. ?No ST-T changes of ischemia. ?The blood pressure response was normal. ?The heart rate response was normal. ?No arrhythmias noted except rare PVCs. ? ?PCV ECHOCARDIOGRAM COMPLETE 05/10/2021  ?Left ventricle cavity is normal in size and wall thickness. Normal global wall motion. Normal LV systolic function with EF 56%. Normal diastolic filling pattern. ?Mild tricuspid regurgitation. ?Mild pulmonic regurgitation. ?No evidence of pulmonary hypertension. ?    ? ?EKG:  ? ?EKG 04/16/2021: Normal sinus rhythm at rate of 84 bpm, normal axis.  No evidence of ischemia, normal EKG.   ? ? ?Assessment  ? ?  ICD-10-CM   ?1. Hypercholesteremia  E78.00 atorvastatin (LIPITOR) 20 MG tablet  ?  ezetimibe (ZETIA) 10 MG tablet  ?  ?2. Elevated coronary artery calcium score 05/17/2021: Total Agatston score  267, [place] database percentile 92.  R93.1 aspirin EC 81  MG tablet  ?  ?  ? ?Medications Discontinued During This Encounter  ?Medication Reason  ? atorvastatin (LIPITOR) 40 MG tablet   ? ezetimibe (ZETIA) 10 MG tablet Reorder  ? ?  ?Meds ordered this encounter  ?Medications  ? ezetimibe (ZETIA) 10 MG tablet  ?  Sig: Take 1 tablet (10 mg total) by mouth daily after supper.  ?  Dispense:  90 tablet  ?  Refill:  3  ? aspirin EC 81 MG tablet  ?  Sig: Take 1 tablet (81 mg total) by mouth daily.  ?  Dispense:  90 tablet  ?  Refill:  3  ? ?No orders of the defined types were placed in this encounter. ? ?Recommendations:  ? ?Jasmine PangOmar Mohammed Jiles Prowsta is a 61 y.o. [place] male, with no signal prior cardiovascular history except for hyperlipidemia not on therapy, initially seen for recurrent syncope felt to be clearly due to hypovolemic syncope during acute illness. ? ?Due to elevated coronary calcium score in the 90th percentile, I started him on high intensity statins along with Zetia he now presents for follow-up.  He is asymptomatic, has had a low risk exercise stress test and also echocardiogram has been normal. ? ?His lipids are now under excellent control.  However the LFTs are minimally elevated.  We will reduce the dose of Lipitor from 40 mg to 20 mg daily, continue ezetimibe 10 mg daily.  Goal LDL would be closer to 70, would be great to have a LDL closer to 55.  Only reason for changing the dose of the medication was twice the elevation in LFTs. ? ?I suspect his LFTs will normalize.  As it is not >3 times the upper limit of normal, no further evaluation is indicated at a near future.  He has an appointment with his PCP in the first week of November, complete physical examination will be performed at that time and LFTs can be followed along with lipids.  I will see him back on a as needed basis.  At this point continued primary/secondary prevention only is indicated.  He will start aspirin 81 mg daily.  ?  ? ? ?Yates DecampJay Aryia Delira, MD,  [facility] ?08/06/2021, 12:07 PM ?Office: [phone] ?

## 2022-02-25 DIAGNOSIS — Z23 Encounter for immunization: Secondary | ICD-10-CM | POA: Diagnosis not present

## 2022-02-25 DIAGNOSIS — K219 Gastro-esophageal reflux disease without esophagitis: Secondary | ICD-10-CM | POA: Diagnosis not present

## 2022-02-25 DIAGNOSIS — R931 Abnormal findings on diagnostic imaging of heart and coronary circulation: Secondary | ICD-10-CM | POA: Diagnosis not present

## 2022-02-25 DIAGNOSIS — E7800 Pure hypercholesterolemia, unspecified: Secondary | ICD-10-CM | POA: Diagnosis not present

## 2022-02-25 DIAGNOSIS — Z0000 Encounter for general adult medical examination without abnormal findings: Secondary | ICD-10-CM | POA: Diagnosis not present

## 2022-02-25 DIAGNOSIS — Z125 Encounter for screening for malignant neoplasm of prostate: Secondary | ICD-10-CM | POA: Diagnosis not present

## 2022-02-25 DIAGNOSIS — R7303 Prediabetes: Secondary | ICD-10-CM | POA: Diagnosis not present

## 2022-03-18 DIAGNOSIS — Z1211 Encounter for screening for malignant neoplasm of colon: Secondary | ICD-10-CM | POA: Diagnosis not present

## 2022-07-20 DIAGNOSIS — R059 Cough, unspecified: Secondary | ICD-10-CM | POA: Diagnosis not present

## 2022-08-24 ENCOUNTER — Other Ambulatory Visit: Payer: Self-pay | Admitting: Cardiology

## 2022-08-24 DIAGNOSIS — E7800 Pure hypercholesterolemia, unspecified: Secondary | ICD-10-CM

## 2022-09-27 ENCOUNTER — Other Ambulatory Visit: Payer: Self-pay | Admitting: Cardiology

## 2022-09-27 DIAGNOSIS — E7800 Pure hypercholesterolemia, unspecified: Secondary | ICD-10-CM

## 2022-10-21 DIAGNOSIS — L539 Erythematous condition, unspecified: Secondary | ICD-10-CM | POA: Diagnosis not present

## 2022-10-21 DIAGNOSIS — W57XXXA Bitten or stung by nonvenomous insect and other nonvenomous arthropods, initial encounter: Secondary | ICD-10-CM | POA: Diagnosis not present

## 2022-10-21 DIAGNOSIS — L0390 Cellulitis, unspecified: Secondary | ICD-10-CM | POA: Diagnosis not present

## 2023-03-13 DIAGNOSIS — Z125 Encounter for screening for malignant neoplasm of prostate: Secondary | ICD-10-CM | POA: Diagnosis not present

## 2023-03-13 DIAGNOSIS — E7800 Pure hypercholesterolemia, unspecified: Secondary | ICD-10-CM | POA: Diagnosis not present

## 2023-03-13 DIAGNOSIS — K219 Gastro-esophageal reflux disease without esophagitis: Secondary | ICD-10-CM | POA: Diagnosis not present

## 2023-03-13 DIAGNOSIS — R7303 Prediabetes: Secondary | ICD-10-CM | POA: Diagnosis not present

## 2023-03-13 DIAGNOSIS — Z0000 Encounter for general adult medical examination without abnormal findings: Secondary | ICD-10-CM | POA: Diagnosis not present

## 2023-03-24 IMAGING — CT CT CARDIAC CORONARY ARTERY CALCIUM SCORE
2 of 6 series · 4 of 20 positions shown, 5 images · non-contrast
Comparison: None.

CLINICAL DATA: 60-year-old Asian male with history of
hyperlipidemia and family history of heart disease.

EXAM:
CT CARDIAC CORONARY ARTERY CALCIUM SCORE
TECHNIQUE: Non-contrast imaging through the heart was performed using
prospective ECG gating. Image post processing was performed on an
independent workstation, allowing for quantitative analysis of the
heart and coronary arteries. Note that this exam targets the heart
and the chest was not imaged in its entirety.

[Series 2: calcium scoring 2.00 qr36 bestdiast 71% hrt calciu · axial · 0.32mm/px · z∈[+1782,+1834]mm · 2 of 80 slices shown, 3 images]
[im 27/80  vessel]
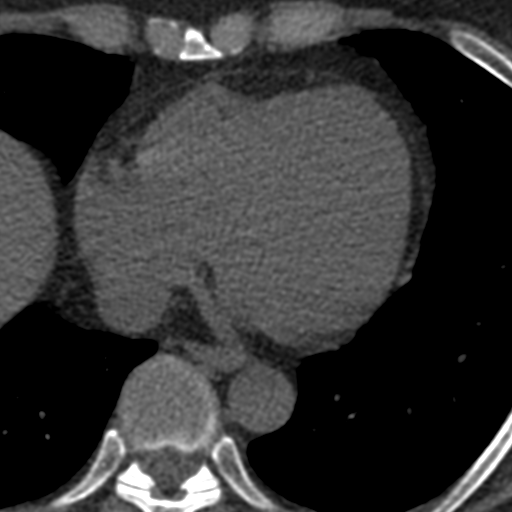
[im 27/80  lung]
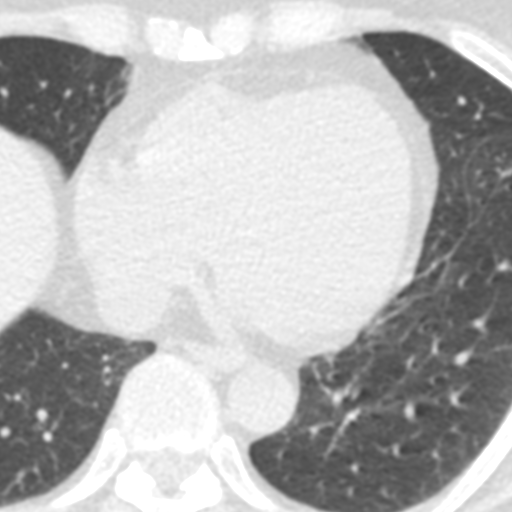
[im 53/80  vessel]
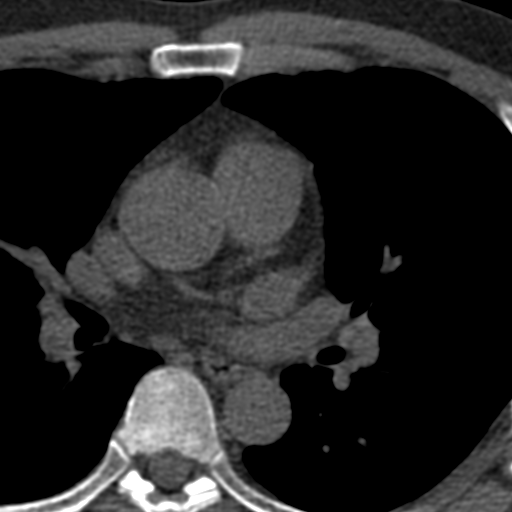

[Series 3: calcium scoring 2.00 br40 bestdiast 71% axial · axial · 0.46mm/px · z∈[+1782,+1834]mm · 2 of 80 slices shown]
[im 27/80  vessel]
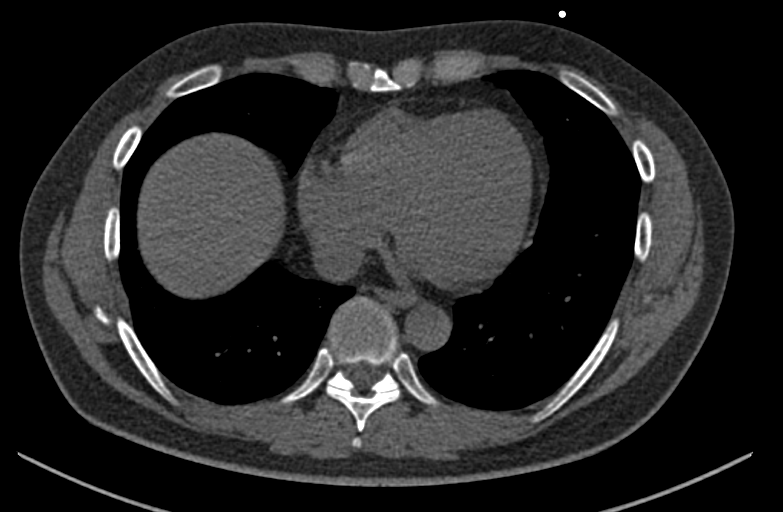
[im 53/80  vessel]
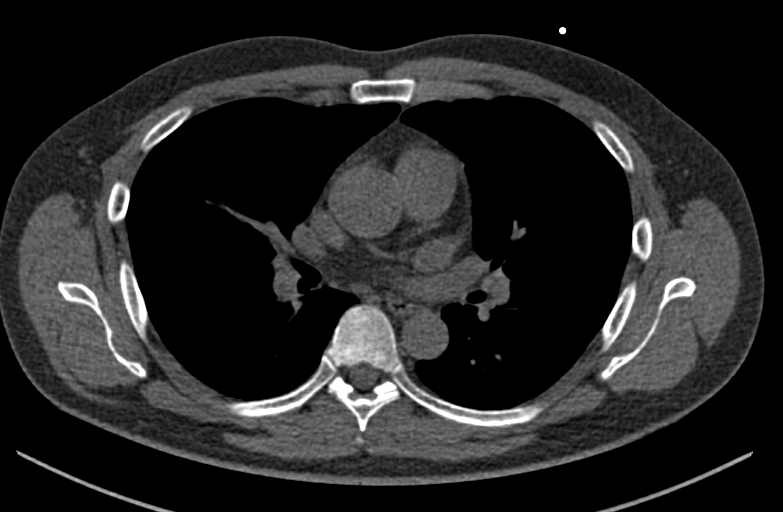

[4 of 20 positions shown; findings below may reference images not displayed]

FINDINGS: CORONARY CALCIUM SCORES:

Left Main: 0

LAD: 179

LCx: 88

RCA: 0

Total Agatston Score: 267

[HOSPITAL] percentile: 92

AORTA MEASUREMENTS:

Ascending Aorta: 31 mm

Descending Aorta: 21 mm

OTHER FINDINGS:

The heart size is within normal limits. No pericardial fluid is
identified. Visualized segments of the thoracic aorta and central
pulmonary arteries are normal in caliber. Visualized mediastinum and
hilar regions demonstrate no lymphadenopathy or masses. Visualized
lungs show no evidence of pulmonary edema, consolidation,
pneumothorax, nodule or pleural fluid. Visualized upper abdomen and
bony structures are unremarkable.
IMPRESSION: Coronary calcium score of 267 is at the 92nd percentile for the
patient's sex, age and race.
